# Patient Record
Sex: Male | Born: 1935 | Race: White | Hispanic: No | Marital: Married | Smoking: Never smoker
Health system: Southern US, Community
[De-identification: ages and names within clinical notes are randomized; demographics above are authoritative.]

## PROBLEM LIST (undated history)

## (undated) DIAGNOSIS — G309 Alzheimer's disease, unspecified: Secondary | ICD-10-CM

## (undated) DIAGNOSIS — K219 Gastro-esophageal reflux disease without esophagitis: Secondary | ICD-10-CM

## (undated) DIAGNOSIS — N400 Enlarged prostate without lower urinary tract symptoms: Secondary | ICD-10-CM

## (undated) DIAGNOSIS — I69320 Aphasia following cerebral infarction: Secondary | ICD-10-CM

## (undated) DIAGNOSIS — I482 Chronic atrial fibrillation, unspecified: Secondary | ICD-10-CM

## (undated) DIAGNOSIS — I639 Cerebral infarction, unspecified: Secondary | ICD-10-CM

## (undated) DIAGNOSIS — F0280 Dementia in other diseases classified elsewhere without behavioral disturbance: Secondary | ICD-10-CM

---

## 2016-03-04 ENCOUNTER — Emergency Department (HOSPITAL_BASED_OUTPATIENT_CLINIC_OR_DEPARTMENT_OTHER): Payer: Medicare Other

## 2016-03-04 ENCOUNTER — Emergency Department (HOSPITAL_BASED_OUTPATIENT_CLINIC_OR_DEPARTMENT_OTHER)
Admission: EM | Admit: 2016-03-04 | Discharge: 2016-03-05 | Disposition: A | Discharge status: Home / Self Care | Payer: Medicare Other | Attending: Emergency Medicine | Admitting: Emergency Medicine

## 2016-03-04 ENCOUNTER — Encounter (HOSPITAL_BASED_OUTPATIENT_CLINIC_OR_DEPARTMENT_OTHER): Payer: Self-pay

## 2016-03-04 DIAGNOSIS — Y939 Activity, unspecified: Secondary | ICD-10-CM | POA: Diagnosis not present

## 2016-03-04 DIAGNOSIS — Y929 Unspecified place or not applicable: Secondary | ICD-10-CM | POA: Diagnosis not present

## 2016-03-04 DIAGNOSIS — Y999 Unspecified external cause status: Secondary | ICD-10-CM | POA: Diagnosis not present

## 2016-03-04 DIAGNOSIS — F0390 Unspecified dementia without behavioral disturbance: Secondary | ICD-10-CM | POA: Diagnosis not present

## 2016-03-04 DIAGNOSIS — Z8673 Personal history of transient ischemic attack (TIA), and cerebral infarction without residual deficits: Secondary | ICD-10-CM | POA: Diagnosis not present

## 2016-03-04 DIAGNOSIS — W050XXA Fall from non-moving wheelchair, initial encounter: Secondary | ICD-10-CM | POA: Diagnosis not present

## 2016-03-04 DIAGNOSIS — Z79899 Other long term (current) drug therapy: Secondary | ICD-10-CM | POA: Insufficient documentation

## 2016-03-04 DIAGNOSIS — W19XXXA Unspecified fall, initial encounter: Secondary | ICD-10-CM

## 2016-03-04 HISTORY — DX: Alzheimer's disease, unspecified: G30.9

## 2016-03-04 HISTORY — DX: Dementia in other diseases classified elsewhere, unspecified severity, without behavioral disturbance, psychotic disturbance, mood disturbance, and anxiety: F02.80

## 2016-03-04 HISTORY — DX: Gastro-esophageal reflux disease without esophagitis: K21.9

## 2016-03-04 HISTORY — DX: Chronic atrial fibrillation, unspecified: I48.20

## 2016-03-04 HISTORY — DX: Benign prostatic hyperplasia without lower urinary tract symptoms: N40.0

## 2016-03-04 HISTORY — DX: Aphasia following cerebral infarction: I69.320

## 2016-03-04 HISTORY — DX: Cerebral infarction, unspecified: I63.9

## 2016-03-04 NOTE — ED Notes (Signed)
Per EMS pt from [facility], states he is wheelchair bound but tries to walk, stood up from chair and fell back, hit the back of his head on the floor. No bleeding noted, raised area noted but unsure if it's new or old. No LOC

## 2016-03-04 NOTE — ED Provider Notes (Signed)
[facility] Provider Note   CSN: [id number] Arrival date & time: 03/04/16  2052 By signing my name below, I, Bridgette Habermann, attest that this documentation has been prepared under the direction and in the presence of Tomasita Crumble, MD. Electronically Signed: Bridgette Habermann, ED Scribe. 03/04/16. 9:33 PM.  History   Chief Complaint Chief Complaint  Patient presents with  . Fall   LEVEL 5 CAVEAT: HPI and ROS limited due to dementia.  HPI Comments: Warren Hughes is a 82 y.o. male with h/o dementia who presents to the Emergency Department by EMS from [facility] for evaluation s/p mechanical fall earlier today. Pt is wheelchair bound at baseline but regularly tries to walk on his feet. Per EMS, pt stood up from his chair and fell back, striking the back of his head on the floor. No bleeding was noted. No LOC. Pt is not on blood thinners. He appears to be in no acute distress at this time.   The history is provided by the patient. No language interpreter was used.    Past Medical History:  Diagnosis Date  . Alzheimer's disease   . Aphasia as late effect of cerebrovascular accident   . Atrial fibrillation, chronic (HCC)   . Benign prostate hyperplasia   . GERD (gastroesophageal reflux disease)   . Stroke [facility])    There are no active problems to display for this patient.  History reviewed. No pertinent surgical history.   Home Medications    Prior to Admission medications   Medication Sig Start Date End Date Taking? Authorizing Provider  co-enzyme Q-10 50 MG capsule Take 50 mg by mouth daily.   Yes Historical Provider, MD  donepezil (ARICEPT) 10 MG tablet Take 10 mg by mouth at bedtime.   Yes Historical Provider, MD  potassium chloride (KLOR-CON) 20 MEQ packet Take by mouth 2 (two) times daily.   Yes Historical Provider, MD  pravastatin (PRAVACHOL) 10 MG tablet Take 10 mg by mouth daily.   Yes Historical Provider, MD  QUEtiapine (SEROQUEL) 25 MG tablet Take 25 mg by mouth at  bedtime.   Yes Historical Provider, MD  sertraline (ZOLOFT) 25 MG tablet Take 25 mg by mouth daily.   Yes Historical Provider, MD  tamsulosin (FLOMAX) 0.4 MG CAPS capsule Take 0.4 mg by mouth.   Yes Historical Provider, MD    Family History No family history on file.  Social History Social History  Substance Use Topics  . Smoking status: Never Smoker  . Smokeless tobacco: Never Used  . Alcohol use No     Allergies   Erythromycin; Penicillins; and Vesicare [solifenacin]   Review of Systems Review of Systems  Unable to perform ROS: Dementia   Physical Exam Updated Vital Signs BP 146/75 (BP Location: Right Arm)   Pulse 92   Temp 98.6 F (37 C) (Oral)   Resp 22   Ht 5\' 6"  (1.676 m)   Wt 165 lb (74.8 kg)   SpO2 96%   BMI 26.63 kg/m   Physical Exam  Constitutional: Vital signs are normal. He appears well-developed and well-nourished.  Non-toxic appearance. He does not appear ill. No distress.  HENT:  Head: Normocephalic and atraumatic.  Nose: Nose normal.  Mouth/Throat: Oropharynx is clear and moist. No oropharyngeal exudate.  Eyes: Conjunctivae and EOM are normal. Pupils are equal, round, and reactive to light. No scleral icterus.  Neck: Normal range of motion. Neck supple. No tracheal deviation, no edema, no erythema and normal range of motion present.  No thyroid mass and no thyromegaly present.  Cardiovascular: Normal rate, regular rhythm, S1 normal, S2 normal, normal heart sounds, intact distal pulses and normal pulses.  Exam reveals no gallop and no friction rub.   No murmur heard. Pulmonary/Chest: Effort normal and breath sounds normal. No respiratory distress. He has no wheezes. He has no rhonchi. He has no rales.  Abdominal: Soft. Normal appearance and bowel sounds are normal. He exhibits no distension, no ascites and no mass. There is no hepatosplenomegaly. There is no tenderness. There is no rebound, no guarding and no CVA tenderness.  Musculoskeletal: Normal  range of motion. He exhibits no edema or tenderness.  Lymphadenopathy:    He has no cervical adenopathy.  Neurological: He is alert. He has normal strength. No cranial nerve deficit or sensory deficit.  Skin: Skin is warm, dry and intact. No petechiae and no rash noted. He is not diaphoretic. No erythema. No pallor.  Nursing note and vitals reviewed.  ED Treatments / Results  DIAGNOSTIC STUDIES: Oxygen Saturation is 96% on RA, adequate by my interpretation.    COORDINATION OF CARE: 9:19 PM Discussed treatment plan with pt at bedside and pt agreed to plan.  Labs (all labs ordered are listed, but only abnormal results are displayed) Labs Reviewed - No data to display  EKG  EKG Interpretation None       Radiology No results found.  Procedures Procedures (including critical care time)  Medications Ordered in ED Medications - No data to display   Initial Impression / Assessment and Plan / ED Course  I have reviewed the triage vital signs and the nursing notes.  Pertinent labs & imaging results that were available during my care of the patient were reviewed by me and considered in my medical decision making (see chart for details).  Clinical Course    Patient presents to the ED for an unwitnessed fall.  CT negative for any injuries.  He appears well and in NAD.  Neuro exam remains normal.  VS remain within his normal limits and he is safe for DC.  Final Clinical Impressions(s) / ED Diagnoses   Final diagnoses:  None   I personally performed the services described in this documentation, which was scribed in my presence. The recorded information has been reviewed and is accurate.    New Prescriptions New Prescriptions   No medications on file     Tomasita CrumbleAdeleke Wendle Kina, MD 03/04/16 2318

## 2016-06-10 DEATH — deceased

## 2018-05-23 IMAGING — CT CT HEAD W/O CM
3 of 7 series · 13 of 47 positions shown, 15 images · non-contrast
Comparison: None.

CLINICAL DATA: Status post fall backwards, hitting back of head.
Concern for head or cervical spine injury. Initial encounter.

EXAM:
CT HEAD WITHOUT CONTRAST
CT CERVICAL SPINE WITHOUT CONTRAST
TECHNIQUE: Multidetector CT imaging of the head and cervical spine was
performed following the standard protocol without intravenous
contrast. Multiplanar CT image reconstructions of the cervical spine
were also generated.

[Series 8: head 3.0 mpr cor · coronal · 0.30mm/px · 3 of 66 slices shown]
[im 14/66  brain]
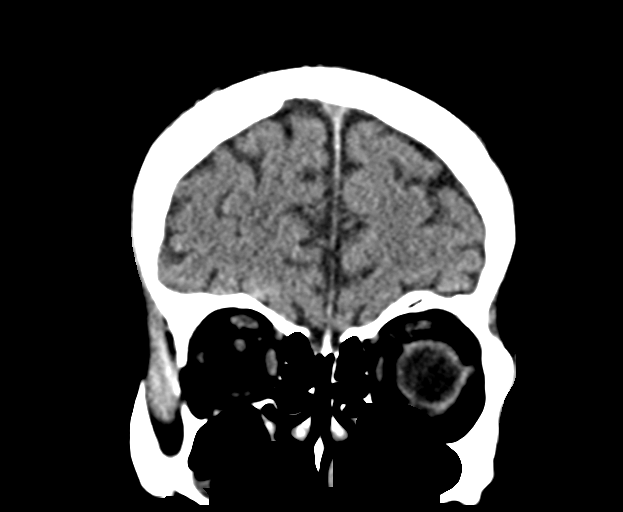
[im 27/66  brain]
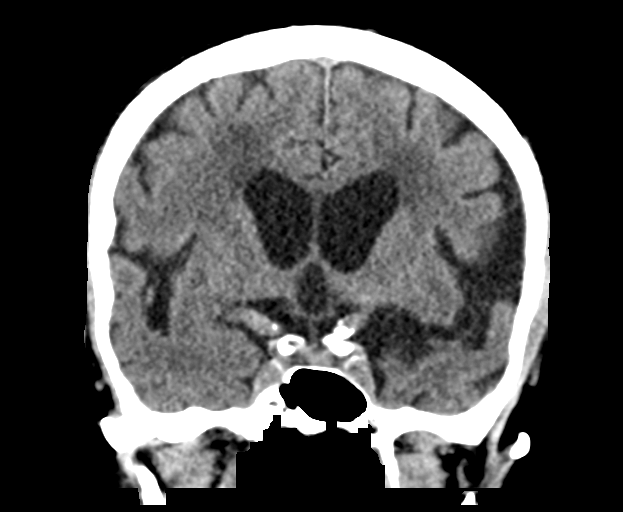
[im 40/66  brain]
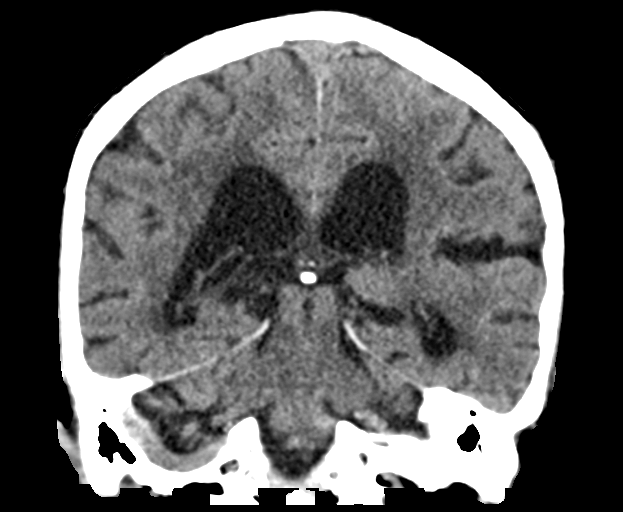

[Series 9: head 3.0 mpr sag · sagittal · 0.30mm/px · 2 of 55 slices shown]
[im 19/55  brain]
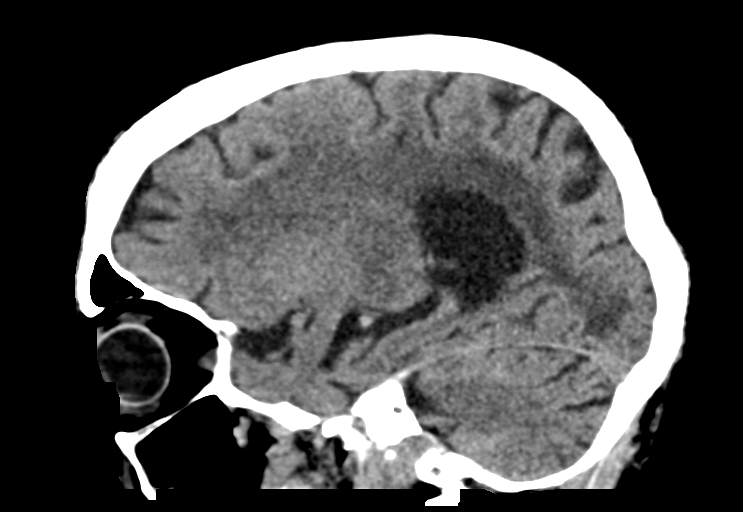
[im 37/55  brain]
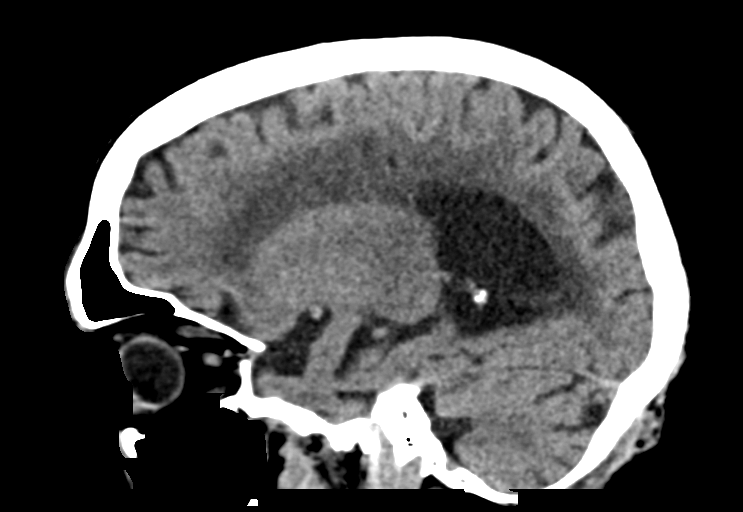

[Series 12: orthogonals · axial · 0.24mm/px · z∈[-311,-162]mm · 8 of 91 slices shown, 10 images]
[im 8/91  brain]
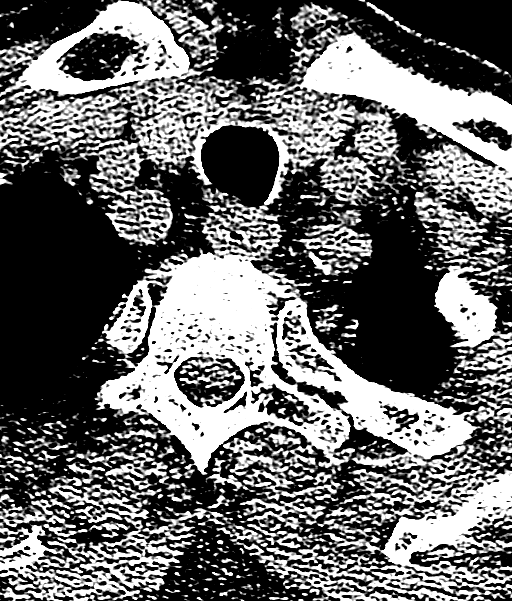
[im 8/91  bone]
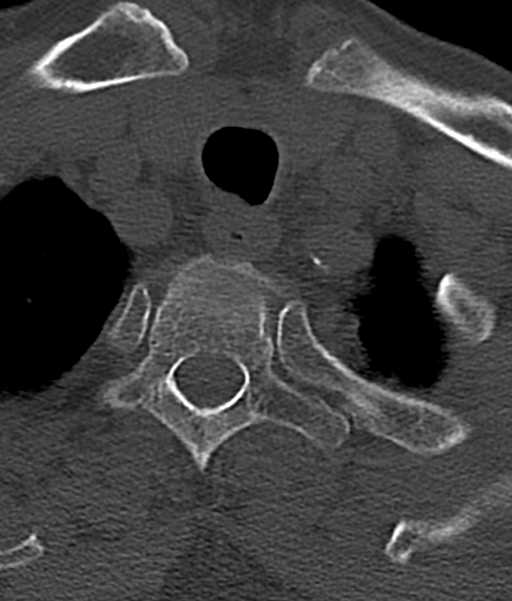
[im 23/91  brain]
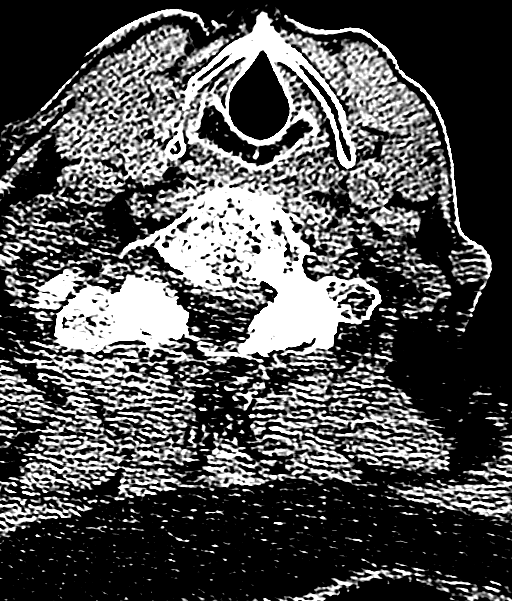
[im 31/91  brain]
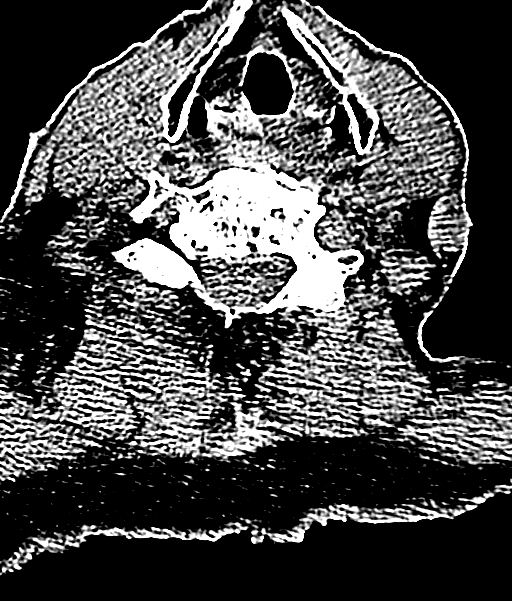
[im 38/91  brain]
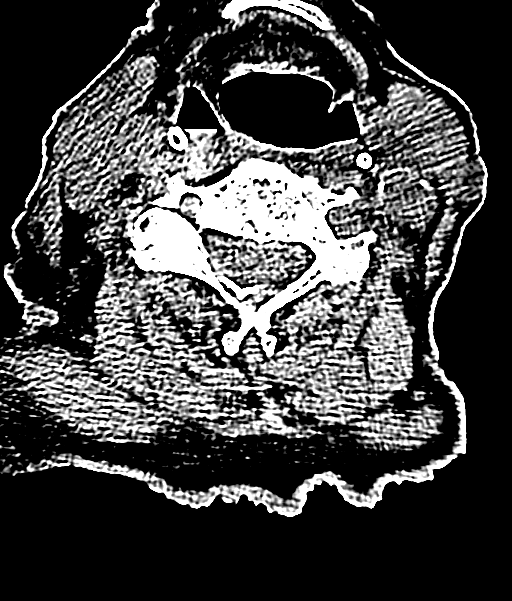
[im 53/91  brain]
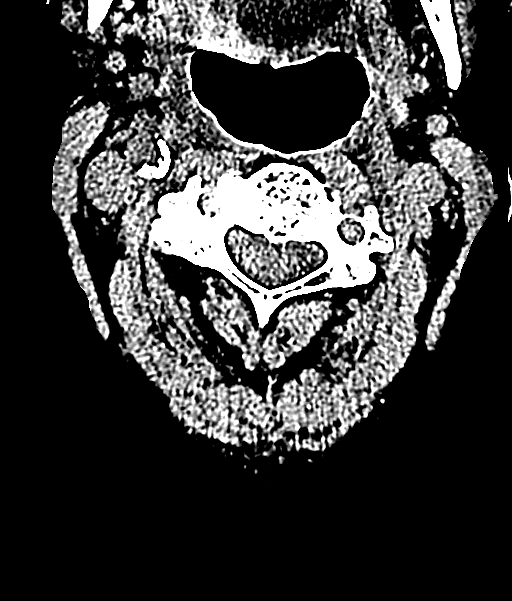
[im 53/91  bone]
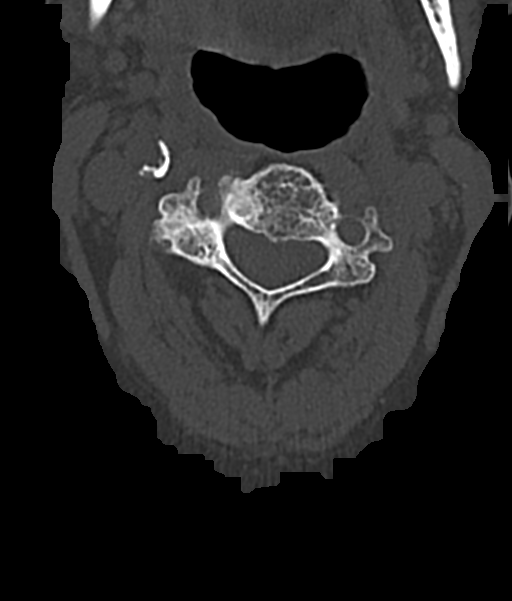
[im 61/91  brain]
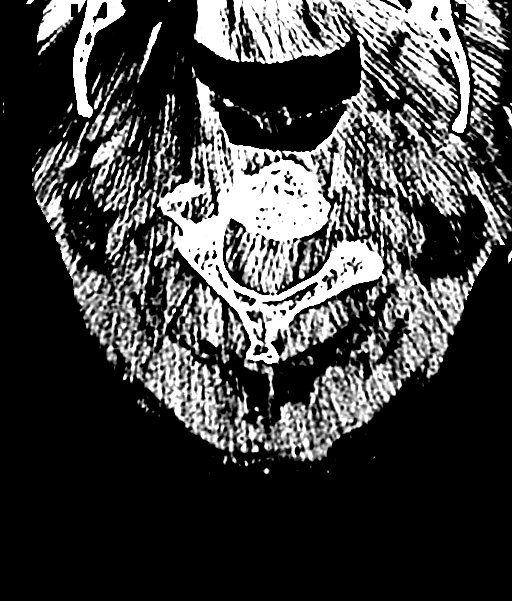
[im 68/91  brain]
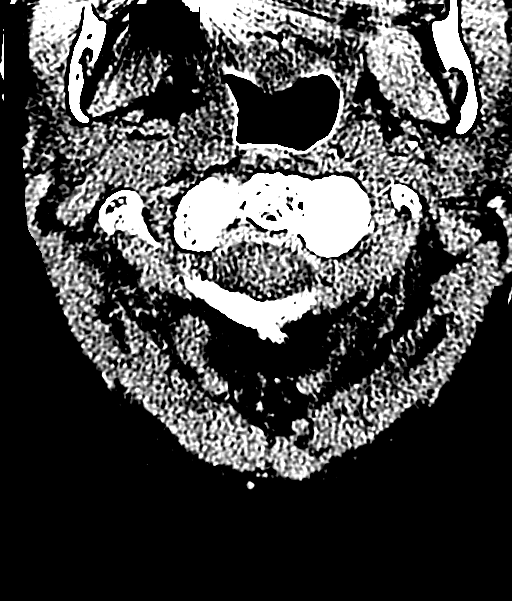
[im 83/91  brain]
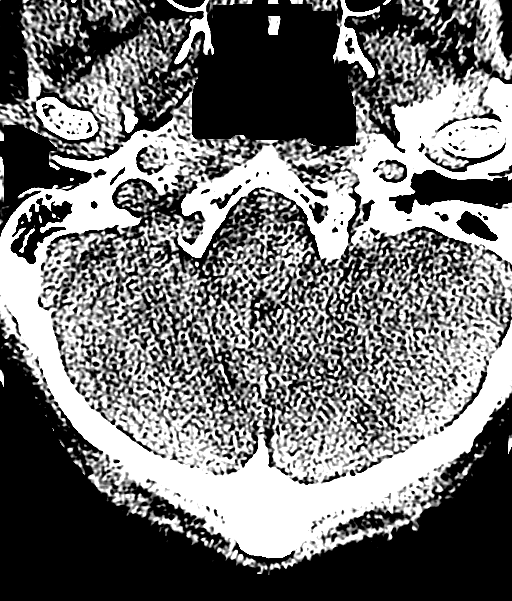

[13 of 47 positions shown; findings below may reference images not displayed]

FINDINGS: CT HEAD FINDINGS

Brain: No evidence of acute infarction, hemorrhage, hydrocephalus,
extra-axial collection or mass lesion/mass effect.

Prominence of the ventricles and sulci reflects moderate cortical
volume loss. Mild cerebellar atrophy is noted. Scattered
periventricular and subcortical white matter change likely reflects
small vessel ischemic microangiopathy.

The brainstem and fourth ventricle are within normal limits. The
basal ganglia are unremarkable in appearance. The cerebral
hemispheres demonstrate grossly normal gray-white differentiation.
No mass effect or midline shift is seen.

Vascular: No hyperdense vessel or unexpected calcification.

Skull: There is no evidence of fracture; visualized osseous
structures are unremarkable in appearance.

Sinuses/Orbits: The visualized portions of the orbits are within
normal limits. Mucosal thickening is noted at the right maxillary
sinus. The remaining paranasal sinuses and mastoid air cells are
well-aerated.

Other: No significant soft tissue abnormalities are seen.

CT CERVICAL SPINE FINDINGS

Alignment: Normal.

Skull base and vertebrae: No acute fracture. No primary bone lesion
or focal pathologic process.

Soft tissues and spinal canal: No prevertebral fluid or swelling. No
visible canal hematoma.

Disc levels: Multilevel disc space narrowing is noted along the
cervical spine, with scattered anterior and posterior disc
osteophyte complexes, and mild underlying facet disease.

Upper chest: The visualized portions of the thyroid gland are
unremarkable. Scattered calcification is noted at the carotid
bifurcations bilaterally. The visualized lung apices appear grossly
clear.

Other: No additional soft tissue abnormalities are seen.
IMPRESSION: 1. No evidence of traumatic intracranial injury or fracture.
2. No evidence of fracture or subluxation along the cervical spine.
3. Moderate cortical volume loss and scattered small vessel ischemic
microangiopathy.
4. Mild diffuse degenerative change along the cervical spine.
5. Scattered calcification at the carotid bifurcations bilaterally.
